# Patient Record
Sex: Male | Born: 1995 | Race: Black or African American | Hispanic: No | Marital: Single
Health system: Southern US, Community
[De-identification: ages and names within clinical notes are randomized; demographics above are authoritative.]

---
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
## 2016-09-23 ENCOUNTER — Emergency Department (HOSPITAL_BASED_OUTPATIENT_CLINIC_OR_DEPARTMENT_OTHER): Payer: Self-pay

## 2016-09-23 ENCOUNTER — Emergency Department (HOSPITAL_BASED_OUTPATIENT_CLINIC_OR_DEPARTMENT_OTHER)
Admission: EM | Admit: 2016-09-23 | Discharge: 2016-09-24 | Disposition: A | Discharge status: Home / Self Care | Payer: Self-pay | Attending: Emergency Medicine | Admitting: Emergency Medicine

## 2016-09-23 ENCOUNTER — Encounter (HOSPITAL_BASED_OUTPATIENT_CLINIC_OR_DEPARTMENT_OTHER): Payer: Self-pay | Admitting: Emergency Medicine

## 2016-09-23 DIAGNOSIS — Y9355 Activity, bike riding: Secondary | ICD-10-CM | POA: Insufficient documentation

## 2016-09-23 DIAGNOSIS — S0181XA Laceration without foreign body of other part of head, initial encounter: Secondary | ICD-10-CM

## 2016-09-23 DIAGNOSIS — S60122A Contusion of left index finger with damage to nail, initial encounter: Secondary | ICD-10-CM | POA: Insufficient documentation

## 2016-09-23 DIAGNOSIS — S01111A Laceration without foreign body of right eyelid and periocular area, initial encounter: Secondary | ICD-10-CM | POA: Insufficient documentation

## 2016-09-23 DIAGNOSIS — Y999 Unspecified external cause status: Secondary | ICD-10-CM | POA: Insufficient documentation

## 2016-09-23 DIAGNOSIS — S6010XA Contusion of unspecified finger with damage to nail, initial encounter: Secondary | ICD-10-CM

## 2016-09-23 DIAGNOSIS — Y929 Unspecified place or not applicable: Secondary | ICD-10-CM | POA: Insufficient documentation

## 2016-09-23 MED ORDER — LIDOCAINE HCL 2 % IJ SOLN
10.0000 mL | Freq: Once | INTRAMUSCULAR | Status: DC
  Filled 2016-09-23: qty 20

## 2016-09-23 MED ORDER — TETANUS-DIPHTH-ACELL PERTUSSIS 5-2.5-18.5 LF-MCG/0.5 IM SUSP
0.5000 mL | Freq: Once | INTRAMUSCULAR | Status: DC
  Filled 2016-09-23: qty 0.5
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
## 2016-09-24 MED ORDER — NAPROXEN 500 MG PO TABS: 500.0000 mg | ORAL_TABLET | Freq: Two times a day (BID) | ORAL | 0 refills | Status: AC

## 2016-09-24 MED ORDER — HYDROCODONE-ACETAMINOPHEN 5-325 MG PO TABS: 1.0000 | ORAL_TABLET | Freq: Once | ORAL | Status: DC

## 2016-09-24 NOTE — ED Notes (Signed)
Pt discharged to home with family. NAD.  

## 2016-09-24 NOTE — ED Notes (Signed)
ED Provider at bedside. 

## 2016-09-24 NOTE — Discharge Instructions (Signed)
Fever seen today for an injury to her left finger. Blood was evacuated. Keep the finger dressed and clean. This should improve over the next 2-3 days. You were also seen for laceration. Given the delay in seeking care, this was not repaired. You need to keep antibiotic ointment on it and watch for signs and symptoms of infection.

## 2016-09-24 NOTE — ED Notes (Signed)
Pt refused tetanus states he had one in April 2017

## 2018-06-20 IMAGING — DX DG FINGER INDEX 2+V*L*
3 series · 3 of 3 positions shown · non-contrast
Comparison: None.

CLINICAL DATA: 20-year-old male with injury to the left index
finger.

EXAM:
LEFT INDEX FINGER 2+V

[finger ap]
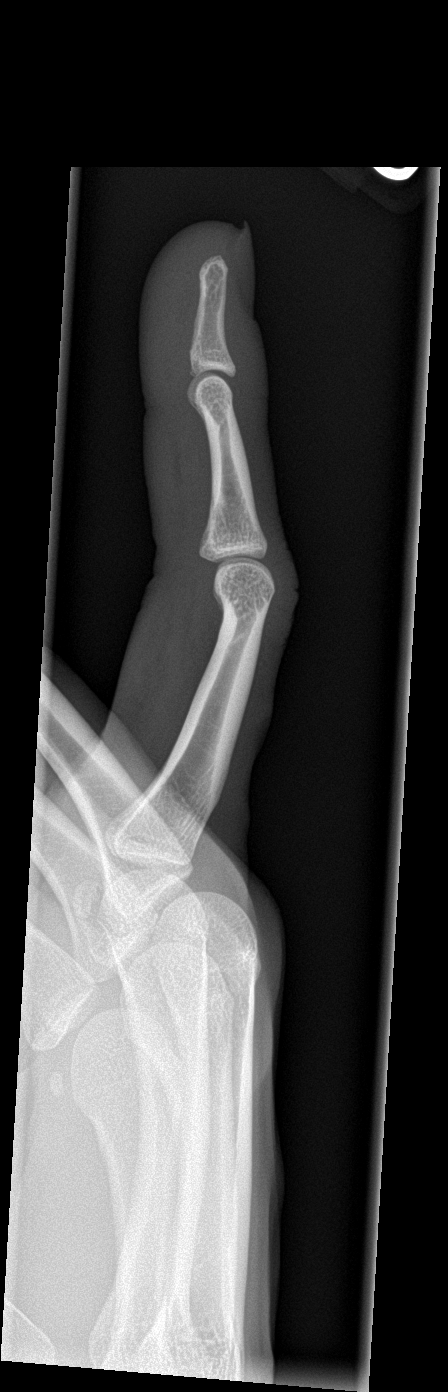

[finger obl]
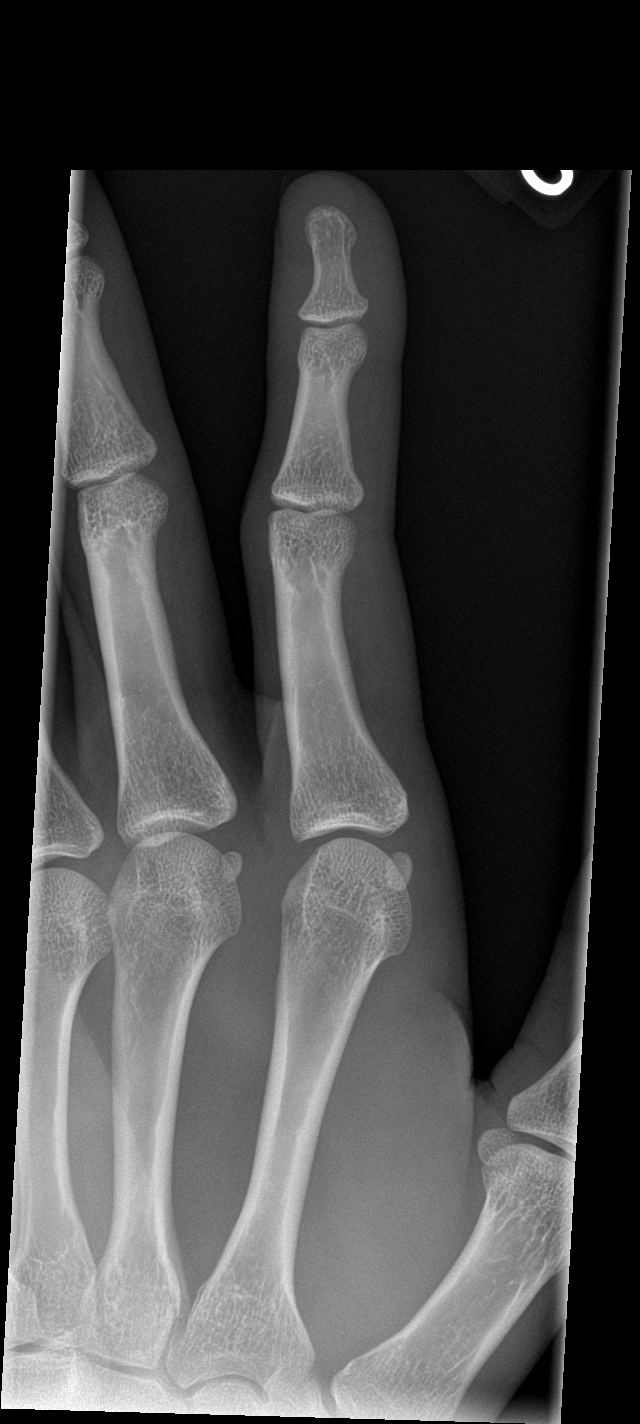

[finger lat]
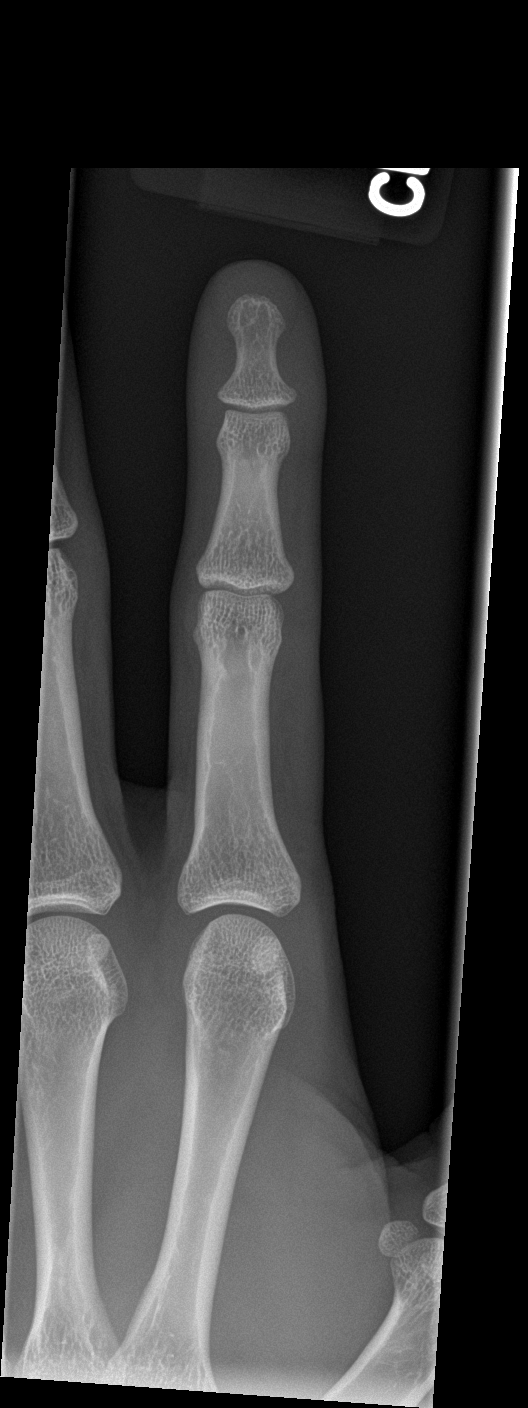

[3 of 3 positions shown; findings below may reference images not displayed]

FINDINGS: There is no evidence of fracture or dislocation. There is no
evidence of arthropathy or other focal bone abnormality. Soft
tissues are unremarkable.
IMPRESSION: Negative.
